# Patient Record
Sex: Male | Born: 1980 | Race: White | Hispanic: No | Marital: Married | Smoking: Never smoker
Health system: Southern US, Community
[De-identification: ages and names within clinical notes are randomized; demographics above are authoritative.]

## PROBLEM LIST (undated history)

## (undated) DIAGNOSIS — M791 Myalgia, unspecified site: Secondary | ICD-10-CM

## (undated) DIAGNOSIS — M542 Cervicalgia: Secondary | ICD-10-CM

## (undated) DIAGNOSIS — K279 Peptic ulcer, site unspecified, unspecified as acute or chronic, without hemorrhage or perforation: Secondary | ICD-10-CM

## (undated) DIAGNOSIS — J45909 Unspecified asthma, uncomplicated: Secondary | ICD-10-CM

## (undated) DIAGNOSIS — T7840XA Allergy, unspecified, initial encounter: Secondary | ICD-10-CM

## (undated) HISTORY — PX: SPINE SURGERY: SHX786

## (undated) HISTORY — DX: Myalgia, unspecified site: M79.10

## (undated) HISTORY — DX: Cervicalgia: M54.2

## (undated) HISTORY — DX: Unspecified asthma, uncomplicated: J45.909

## (undated) HISTORY — PX: APPENDECTOMY: SHX54

## (undated) HISTORY — DX: Allergy, unspecified, initial encounter: T78.40XA

## (undated) HISTORY — DX: Peptic ulcer, site unspecified, unspecified as acute or chronic, without hemorrhage or perforation: K27.9

---
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
## 2002-08-03 HISTORY — PX: SHOULDER SURGERY: SHX246

## 2013-09-06 ENCOUNTER — Encounter: Payer: Self-pay | Admitting: Family Medicine

## 2013-09-06 DIAGNOSIS — T7840XA Allergy, unspecified, initial encounter: Secondary | ICD-10-CM | POA: Insufficient documentation

## 2013-09-06 DIAGNOSIS — J45909 Unspecified asthma, uncomplicated: Secondary | ICD-10-CM | POA: Insufficient documentation

## 2013-09-18 ENCOUNTER — Ambulatory Visit (INDEPENDENT_AMBULATORY_CARE_PROVIDER_SITE_OTHER): Payer: BC Managed Care – PPO | Admitting: Physician Assistant

## 2013-09-18 ENCOUNTER — Encounter: Payer: Self-pay | Admitting: Physician Assistant

## 2013-09-18 VITALS — BP 142/98 | HR 100 | Temp 98.9°F | Resp 20 | Ht 70.5 in | Wt 208.0 lb

## 2013-09-18 DIAGNOSIS — R5383 Other fatigue: Secondary | ICD-10-CM

## 2013-09-18 DIAGNOSIS — R631 Polydipsia: Secondary | ICD-10-CM

## 2013-09-18 DIAGNOSIS — R350 Frequency of micturition: Secondary | ICD-10-CM

## 2013-09-18 DIAGNOSIS — R5381 Other malaise: Secondary | ICD-10-CM

## 2013-09-18 DIAGNOSIS — R110 Nausea: Secondary | ICD-10-CM

## 2013-09-18 DIAGNOSIS — R358 Other polyuria: Secondary | ICD-10-CM

## 2013-09-18 DIAGNOSIS — R634 Abnormal weight loss: Secondary | ICD-10-CM

## 2013-09-18 DIAGNOSIS — R3589 Other polyuria: Secondary | ICD-10-CM

## 2013-09-18 DIAGNOSIS — R197 Diarrhea, unspecified: Secondary | ICD-10-CM

## 2013-09-18 LAB — URINALYSIS, ROUTINE W REFLEX MICROSCOPIC
BILIRUBIN URINE: NEGATIVE
GLUCOSE, UA: NEGATIVE mg/dL
Ketones, ur: NEGATIVE mg/dL
Leukocytes, UA: NEGATIVE
Nitrite: NEGATIVE
PH: 7 (ref 5.0–8.0)
Protein, ur: NEGATIVE mg/dL
SPECIFIC GRAVITY, URINE: 1.01 (ref 1.005–1.030)
Urobilinogen, UA: 1 mg/dL (ref 0.0–1.0)

## 2013-09-18 LAB — COMPLETE METABOLIC PANEL WITH GFR
ALK PHOS: 67 U/L (ref 39–117)
ALT: 18 U/L (ref 0–53)
AST: 19 U/L (ref 0–37)
Albumin: 4.2 g/dL (ref 3.5–5.2)
BILIRUBIN TOTAL: 0.7 mg/dL (ref 0.2–1.2)
BUN: 6 mg/dL (ref 6–23)
CO2: 30 mEq/L (ref 19–32)
Calcium: 9.6 mg/dL (ref 8.4–10.5)
Chloride: 100 mEq/L (ref 96–112)
Creat: 1.26 mg/dL (ref 0.50–1.35)
GFR, Est African American: 87 mL/min
GFR, Est Non African American: 76 mL/min
GLUCOSE: 73 mg/dL (ref 70–99)
Potassium: 4.9 mEq/L (ref 3.5–5.3)
Sodium: 136 mEq/L (ref 135–145)
Total Protein: 7.3 g/dL (ref 6.0–8.3)

## 2013-09-18 LAB — CBC WITH DIFFERENTIAL/PLATELET
BASOS PCT: 1 % (ref 0–1)
Basophils Absolute: 0.1 10*3/uL (ref 0.0–0.1)
EOS ABS: 0.4 10*3/uL (ref 0.0–0.7)
Eosinophils Relative: 4 % (ref 0–5)
HCT: 50 % (ref 39.0–52.0)
HEMOGLOBIN: 17.1 g/dL — AB (ref 13.0–17.0)
Lymphocytes Relative: 23 % (ref 12–46)
Lymphs Abs: 2 10*3/uL (ref 0.7–4.0)
MCH: 30.4 pg (ref 26.0–34.0)
MCHC: 34.2 g/dL (ref 30.0–36.0)
MCV: 89 fL (ref 78.0–100.0)
MONOS PCT: 9 % (ref 3–12)
Monocytes Absolute: 0.8 10*3/uL (ref 0.1–1.0)
Neutro Abs: 5.5 10*3/uL (ref 1.7–7.7)
Neutrophils Relative %: 63 % (ref 43–77)
Platelets: 245 10*3/uL (ref 150–400)
RBC: 5.62 MIL/uL (ref 4.22–5.81)
RDW: 14.9 % (ref 11.5–15.5)
WBC: 8.8 10*3/uL (ref 4.0–10.5)

## 2013-09-18 LAB — URINALYSIS, MICROSCOPIC ONLY
Bacteria, UA: NONE SEEN
CRYSTALS: NONE SEEN
Casts: NONE SEEN
SQUAMOUS EPITHELIAL / LPF: NONE SEEN
WBC, UA: NONE SEEN WBC/hpf (ref <3)

## 2013-09-18 LAB — TSH: TSH: 0.967 u[IU]/mL (ref 0.350–4.500)

## 2013-09-18 LAB — OSMOLALITY, URINE: OSMOLALITY UR: 234 mosm/kg — AB (ref 390–1090)

## 2013-09-18 NOTE — Progress Notes (Signed)
Patient ID: Brandon Christensen MRN: [id number], DOB: 09/27/1980, [age] y.o. Date of Encounter: @DATE @  Chief Complaint:  Chief Complaint  Patient presents with  . new pt    back issues wants refill diclofenac,  hasn't been feeling well has been losing weight, tired all the time, frequent  urination and urinating alot,  also having diarrhea    HPI: 33 y.o. year old white male  presents as a new patient to our office today.    He reports that his only known past medical history has included allergies and asthma. Says he had an endoscopy back in 2002 that showed 2 stomach ulcers. Surgical history has included appendectomy and some orthopedic surgeries but that's all. Says he has had no other medical history and no known chronic medical problems.  He went to a urologist last week regarding these current symptoms. Says they did a prostate exam and said that they weren't sure what was causing his symptoms. Pt reports they did some blood work but he has not heard back from that.  He states that for the past 3-4 weeks he has had increased,  frequent urination. Says when he urinates he is urinating very large volume. Says he has weighed himself before and after urinating and "has pee ed out 4 pounds worth of urine."  Also has had 6-1/2 pounds of weight loss in the past 7-10 days. Says that prior to 10 days ago he had been gaining weight. He started  back to the gym and was actually trying to gain weight but all of a sudden over the past 7-10 days "the weight has just fallen off."  Also reports that he is feeling " run down".  He is also feeling extremely thirsty and feels as if he is dehydrated and his mouth is dry.  Says that his blood sugar was normal when he had that checked at the urologist.  Also reports having nausea and diarrhea over the past 3-4 weeks. Says that he is letting out a normal volume of stool but that it is looser than normal.  Says that  for the past month only about 2 stools have  been normal. Otherwise some are loose and some are little bit between normal and loose.  He denies any known recent head injury. Reports that he drinks no type of alcohol at all including beer liquor, wine.. I see that he is on creatine --on his medication list-- he says he's been using this for 15 years and this is nothing new.   Past Medical History  Diagnosis Date  . Allergy   . Asthma   . Peptic ulcer 2002    [place] showed 2 ulcers     Home Meds: See attached medication section for current medication list. Any medications entered into computer today will not appear on this note's list. The medications listed below were entered prior to today. Current Outpatient Prescriptions on File Prior to Visit  Medication Sig Dispense Refill  . diclofenac (VOLTAREN) 75 MG EC tablet Take 75 mg by mouth 2 (two) times daily.      Marland Kitchen. omeprazole (PRILOSEC) 20 MG capsule Take 20 mg by mouth daily.       No current facility-administered medications on file prior to visit.    Allergies:  Allergies  Allergen Reactions  . Amoxicillin Rash  . Clindamycin/Lincomycin Rash    History   Social History  . Marital Status: Married    Spouse Name: N/A  Number of Children: N/A  . Years of Education: N/A   Occupational History  . Not on file.   Social History Main Topics  . Smoking status: Never Smoker   . Smokeless tobacco: Never Used  . Alcohol Use: No  . Drug Use: No  . Sexual Activity: Yes    Birth Control/ Protection: None   Other Topics Concern  . Not on file   Social History Narrative  . No narrative on file    History reviewed. No pertinent family history.   Review of Systems:  See HPI for pertinent ROS. All other ROS negative.    Physical Exam: Blood pressure 142/98, pulse 100, temperature 98.9 F (37.2 C), temperature source Oral, resp. rate 20, height 5' 10.5" (1.791 m), weight 208 lb (94.348 kg)., Body mass index is 29.41 kg/(m^2). General: WNWD  WM. Appears in no acute distress. Neck: Supple. No thyromegaly. No lymphadenopathy. Lungs: Clear bilaterally to auscultation without wheezes, rales, or rhonchi. Breathing is unlabored. Heart: RRR with S1 S2. No murmurs, rubs, or gallops.Borderline tachycardia.  Abdomen: Soft, non-tender, non-distended with normoactive bowel sounds. No hepatomegaly. No rebound/guarding. No obvious abdominal masses. Musculoskeletal:  Strength and tone normal for age. Extremities/Skin: Warm and dry. No clubbing or cyanosis. No edema. No rashes or suspicious lesions. Neuro: Alert and oriented X 3. Moves all extremities spontaneously. Gait is normal. CNII-XII grossly in tact. Psych:  Responds to questions appropriately with a normal affect.     ASSESSMENT AND PLAN:  33 y.o. year old male with  1. Polydipsia - Urinalysis, Routine w reflex microscopic - CREATINE PO; Take by mouth daily. - Urine Microscopic - CBC with Differential - COMPLETE METABOLIC PANEL WITH GFR - TSH - Osmolality, urine - Arginine vasopressin hormone  2. Polyuria - Urinalysis, Routine w reflex microscopic - CREATINE PO; Take by mouth daily. - Urine Microscopic - CBC with Differential - COMPLETE METABOLIC PANEL WITH GFR - TSH - Osmolality, urine - Arginine vasopressin hormone  3. Weight loss - Urinalysis, Routine w reflex microscopic - CREATINE PO; Take by mouth daily. - Urine Microscopic - CBC with Differential - COMPLETE METABOLIC PANEL WITH GFR - TSH - Osmolality, urine - Arginine vasopressin hormone  4. Fatigue - Urinalysis, Routine w reflex microscopic - CREATINE PO; Take by mouth daily. - Urine Microscopic - CBC with Differential - COMPLETE METABOLIC PANEL WITH GFR - TSH - Osmolality, urine - Arginine vasopressin hormone  5. Nausea - Urinalysis, Routine w reflex microscopic - CREATINE PO; Take by mouth daily. - Urine Microscopic - CBC with Differential - COMPLETE METABOLIC PANEL WITH GFR - TSH -  Osmolality, urine - Arginine vasopressin hormone  6. Diarrhea - Urinalysis, Routine w reflex microscopic - CREATINE PO; Take by mouth daily. - Urine Microscopic - CBC with Differential - COMPLETE METABOLIC PANEL WITH GFR - TSH - Osmolality, urine - Arginine vasopressin hormone  7. Frequent urination - Urinalysis, Routine w reflex microscopic - CREATINE PO; Take by mouth daily. - Urine Microscopic - CBC with Differential - COMPLETE METABOLIC PANEL WITH GFR - TSH - Osmolality, urine - Arginine vasopressin hormone   Obtain these labs. I get results, I will follow up with the patient regarding further evaluation/treatment.   Signed, [address] [place], [place], [facility] 09/18/2013 11:48 AM

## 2013-09-21 ENCOUNTER — Telehealth: Payer: Self-pay | Admitting: Family Medicine

## 2013-09-21 DIAGNOSIS — R350 Frequency of micturition: Secondary | ICD-10-CM

## 2013-09-21 MED ORDER — OXYBUTYNIN CHLORIDE ER 5 MG PO TB24: 5.0000 mg | ORAL_TABLET | Freq: Every day | ORAL | Status: DC

## 2013-09-21 NOTE — Telephone Encounter (Signed)
Message copied by Donne AnonPLUMMER, Charlayne Vultaggio M on Thu Sep 21, 2013  1:13 PM ------      Message from: Allayne ButcherIXON, MARY      Created: Thu Sep 21, 2013  7:53 AM       Tell patient that one lab is still pending. I will let him know once we get the results.      Tell him all the other results are in and are all  normal.      Tell him that we will start him on a medication he will take once a day. Have him schedule a followup office visit with me in 3 weeks.      Send prescription for:       Ditropan XL 5mg  one po QD # 30 + 1 refill. ------

## 2013-09-21 NOTE — Telephone Encounter (Signed)
Pt aware of lab results.  Rx for ditropan to pharmacy.  Made 3 week follow up appt.

## 2013-10-02 LAB — ARGININE VASOPRESSIN HORMONE: Arginine Vasopressin: <1 pg/mL — ABNORMAL LOW

## 2013-10-12 ENCOUNTER — Ambulatory Visit: Payer: Self-pay | Admitting: Physician Assistant

## 2013-10-19 ENCOUNTER — Telehealth: Payer: Self-pay | Admitting: Family Medicine

## 2013-10-19 NOTE — Telephone Encounter (Signed)
That is fine to change to the Myrbetriq.  Send prescription for Myrbetriq  One po QD # 30 / 3 refills. He had a recent office visit scheduled with me but did not show.  Tell him that he needs to schedule another followup office visit with me to have in the next month.

## 2013-10-19 NOTE — Telephone Encounter (Signed)
Message copied by Donne AnonPLUMMER, Jatziri Goffredo M on Thu Oct 19, 2013  2:50 PM ------      Message from: [facility], Maralyn SagoSARAH J      Created: Thu Oct 19, 2013 11:49 AM      Contact: [phone]       The generic Ditropan worked but caused sexual side effects      He wants to try Myrbetriq because according to his research it does not cause the side effects.  [facility]  Please call him and let him know ------

## 2013-10-20 ENCOUNTER — Telehealth: Payer: Self-pay | Admitting: Family Medicine

## 2013-10-20 MED ORDER — MIRABEGRON ER 25 MG PO TB24: 25.0000 mg | ORAL_TABLET | Freq: Every day | ORAL | Status: DC

## 2013-10-20 MED ORDER — TOLTERODINE TARTRATE 1 MG PO TABS: 1.0000 mg | ORAL_TABLET | Freq: Two times a day (BID) | ORAL | Status: DC

## 2013-10-20 NOTE — Telephone Encounter (Signed)
Can send prescription for: Detrol 1 mg  one by mouth twice a day  #60 with 3 refills. Thanks

## 2013-10-20 NOTE — Telephone Encounter (Signed)
Pt called to say Myrbetriq was over $200 after insurance paid.  Way too expensive for him!!  Would now like to try Generic Detrol.

## 2013-10-20 NOTE — Telephone Encounter (Signed)
Spoke to patient.  He has already rescheduled appt for 11/02/13.  Told new Rx has been sent to pharmacy.

## 2013-10-20 NOTE — Telephone Encounter (Signed)
Pt aware new Rx sent

## 2013-11-02 ENCOUNTER — Encounter: Payer: Self-pay | Admitting: Physician Assistant

## 2013-11-02 ENCOUNTER — Ambulatory Visit (INDEPENDENT_AMBULATORY_CARE_PROVIDER_SITE_OTHER): Payer: BC Managed Care – PPO | Admitting: Physician Assistant

## 2013-11-02 VITALS — BP 132/84 | HR 92 | Temp 98.4°F | Resp 18 | Wt 215.0 lb

## 2013-11-02 DIAGNOSIS — K219 Gastro-esophageal reflux disease without esophagitis: Secondary | ICD-10-CM

## 2013-11-02 DIAGNOSIS — M545 Low back pain, unspecified: Secondary | ICD-10-CM

## 2013-11-02 DIAGNOSIS — N318 Other neuromuscular dysfunction of bladder: Secondary | ICD-10-CM

## 2013-11-02 DIAGNOSIS — N3281 Overactive bladder: Secondary | ICD-10-CM | POA: Insufficient documentation

## 2013-11-02 DIAGNOSIS — G8929 Other chronic pain: Secondary | ICD-10-CM

## 2013-11-02 DIAGNOSIS — K279 Peptic ulcer, site unspecified, unspecified as acute or chronic, without hemorrhage or perforation: Secondary | ICD-10-CM

## 2013-11-02 MED ORDER — DICLOFENAC SODIUM 75 MG PO TBEC: 75.0000 mg | DELAYED_RELEASE_TABLET | Freq: Two times a day (BID) | ORAL | Status: DC

## 2013-11-02 MED ORDER — TOLTERODINE TARTRATE 1 MG PO TABS
1.0000 mg | ORAL_TABLET | Freq: Two times a day (BID) | ORAL | Status: DC
Start: 2013-11-02 — End: 2014-03-21

## 2013-11-02 MED ORDER — OMEPRAZOLE 20 MG PO CPDR: 20.0000 mg | DELAYED_RELEASE_CAPSULE | Freq: Every day | ORAL | Status: AC

## 2013-11-02 NOTE — Progress Notes (Signed)
Patient ID: Brandon Christensen MRN: [id number], DOB: 1980-08-08, 33 y.o. Date of Encounter: @DATE @  Chief Complaint:  Chief Complaint  Patient presents with  . 3 week follow up    refill diclofenac, is fasting    HPI: 33 y.o. year old white male  presents for followup office visit.  I saw him as a new patient to our office on 09/18/13.  At that visit he reported that for the past 3-4 weeks he's been having increased frequent urination. Reportedly he was also urinating out large volumes of urine. At that time he also had complaints of unintentional weight loss and fatigue. Today he was feeling thirsty and dry mouth. Also said that for 3-4 weeks he had had some nausea and diarrhea.  At the time of that visit he told me that he had recently seen a urologist regarding these symptoms as well. Reported that they were unable to find any abnormalities on their exam.  After the time of the patient's visit with me, I did receive a copy of the urology note. The urology note stated that they felt that his symptoms may be secondary to anxiety. Toay I discussed this with the patient. However he says he has no anxiety whatsoever and this is not the cause of his symptoms.  The time of his visit with me 09/18/13 I did extensive lab work. This Was all normal. I recommended that he start Ditropan XL 5 mg daily and then return for followup visit. Because of insurance coverage [facility] which he is taking twice daily. He says that this is working well. Is completely controlling his symptoms and he is having no adverse effects. Says he has no problems remembering to take it twice a day and was to continue this medication.  As well, he is requesting refills on his diclofenac. Says he's been taking this twice daily for years. Takes this for chronic low back pain. Says that when he was living in [place] he had 2 surgeries on his low back. That surgeon in [place] recommended a third surgery  2 years ago. However patient did not followup with surgery and instead,   the pain has been controlled with his diclofenac ever since.  He has a remote history of peptic ulcer disease back in 2002 but no known ulcer since then. Says he does take the diclofenac with food and also does take Prilosec daily. He is having no symptoms of stomach ulcer.   Past Medical History  Diagnosis Date  . Allergy   . Asthma   . Peptic ulcer 2002    [place] [facility]EGD showed 2 ulcers     Home Meds: See attached medication section for current medication list. Any medications entered into computer today will not appear on this note's list. The medications listed below were entered prior to today. Current Outpatient Prescriptions on File Prior to Visit  Medication Sig Dispense Refill  . CREATINE PO Take by mouth daily.       No current facility-administered medications on file prior to visit.    Allergies:  Allergies  Allergen Reactions  . Amoxicillin Rash  . Clindamycin/Lincomycin Rash    History   Social History  . Marital Status: Married    Spouse Name: N/A    Number of Children: N/A  . Years of Education: N/A   Occupational History  . Not on file.   Social History Main Topics  . Smoking status: Never Smoker   .  Smokeless tobacco: Never Used  . Alcohol Use: No  . Drug Use: No  . Sexual Activity: Yes    Birth Control/ Protection: None   Other Topics Concern  . Not on file   Social History Narrative  . No narrative on file    History reviewed. No pertinent family history.   Review of Systems:  See HPI for pertinent ROS. All other ROS negative.    Physical Exam: Blood pressure 132/84, pulse 92, temperature 98.4 F (36.9 C), temperature source Oral, resp. rate 18, weight 215 lb (97.523 kg)., Body mass index is 30.4 kg/(m^2). General: WNWD WMAppears in no acute distress. Neck: Supple. No thyromegaly. No lymphadenopathy. Lungs: Clear bilaterally to auscultation  without wheezes, rales, or rhonchi. Breathing is unlabored. Heart: RRR with S1 S2. No murmurs, rubs, or gallops. Abdomen: Soft, non-tender, non-distended with normoactive bowel sounds. No hepatomegaly. No rebound/guarding. No obvious abdominal masses. Musculoskeletal:  Strength and tone normal for age. Extremities/Skin: Warm and dry.  No edema. Neuro: Alert and oriented X 3. Moves all extremities spontaneously. Gait is normal. CNII-XII grossly in tact. Psych:  Responds to questions appropriately with a normal affect.     ASSESSMENT AND PLAN:  33 y.o. year old male with  1. Chronic low back pain - diclofenac (VOLTAREN) 75 MG EC tablet; Take 1 tablet (75 mg total) by mouth 2 (two) times daily.  Dispense: 60 tablet; Refill: 11  2. Overactive bladder - tolterodine (DETROL) 1 MG tablet; Take 1 tablet (1 mg total) by mouth 2 (two) times daily.  Dispense: 60 tablet; Refill: 11  3. GERD (gastroesophageal reflux disease) - omeprazole (PRILOSEC) 20 MG capsule; Take 1 capsule (20 mg total) by mouth daily.  Dispense: 30 capsule; Refill: 11 He is currently buying over-the-counter Prilosec. Told him that these are basically the same medications but he can compare the cost of getting the prescription omeprazole versus  over-the-counter Prilosec.  4. H/O PUD (peptic ulcer disease) - omeprazole (PRILOSEC) 20 MG capsule; Take 1 capsule (20 mg total) by mouth daily.  Dispense: 30 capsule; Refill: 11  Can ait one year for followup visit if everything remains stable. Followup sooner if needed.  [address] [place], [place], [facility] 11/02/2013 9:38 AM

## 2013-12-08 ENCOUNTER — Telehealth: Payer: Self-pay | Admitting: Physician Assistant

## 2013-12-08 DIAGNOSIS — M545 Low back pain: Principal | ICD-10-CM

## 2013-12-08 DIAGNOSIS — G8929 Other chronic pain: Secondary | ICD-10-CM

## 2013-12-08 MED ORDER — DICLOFENAC SODIUM 75 MG PO TBEC: 75.0000 mg | DELAYED_RELEASE_TABLET | Freq: Two times a day (BID) | ORAL | Status: DC

## 2013-12-08 NOTE — Telephone Encounter (Signed)
Medication refilled per protocol. 

## 2013-12-08 NOTE — Telephone Encounter (Signed)
[phone] Pt is needing a refill on  diclofenac (VOLTAREN) 75 MG EC tablet he took his last one this morning  [facility]

## 2013-12-27 ENCOUNTER — Encounter: Payer: Self-pay | Admitting: Family Medicine

## 2013-12-27 ENCOUNTER — Ambulatory Visit (INDEPENDENT_AMBULATORY_CARE_PROVIDER_SITE_OTHER): Payer: BC Managed Care – PPO | Admitting: Family Medicine

## 2013-12-27 VITALS — BP 132/70 | HR 78 | Temp 97.4°F | Resp 16 | Ht 70.0 in | Wt 201.0 lb

## 2013-12-27 DIAGNOSIS — M25469 Effusion, unspecified knee: Secondary | ICD-10-CM

## 2013-12-27 DIAGNOSIS — M25569 Pain in unspecified knee: Secondary | ICD-10-CM

## 2013-12-27 DIAGNOSIS — M25562 Pain in left knee: Secondary | ICD-10-CM

## 2013-12-27 DIAGNOSIS — M25462 Effusion, left knee: Secondary | ICD-10-CM

## 2013-12-27 MED ORDER — MELOXICAM 15 MG PO TABS: 15.0000 mg | ORAL_TABLET | Freq: Every day | ORAL | Status: DC

## 2013-12-27 NOTE — Patient Instructions (Signed)
Take the meloxicam   ICE pack  Use ACE Wrap  Call if you want to pursue orthopedics

## 2013-12-28 NOTE — Progress Notes (Signed)
Patient ID: Brandon Christensen, male   DOB: September 02, 1980, 33 y.o.   MRN: [id number]   Subjective:    Patient ID: Brandon Christensen, male    DOB: March 16, 1981, 33 y.o.   MRN: [id number]  Patient presents for L knee pain  patient here with left knee pain. States she was playing basketball on Sunday when he would improve his foot and his knee twisted. He have some pain afterwards but there was no significant swelling he continued to play on it. At the next day he noticed that there was more swelling of the knee towards the middle in his pain when he does any kind of turning motion with his leg and he also feels like it is locking up. He has been taking diclofenac which she's been on for many years secondary to history of back surgeries and shoulder surgeries. He also use an Ace wrap and some ice. He's planning to go out of town this weekend we'll not be able to have any evaluation until he returns.    Review Of Systems:  GEN- denies fatigue, fever, weight loss,weakness, recent illness HEENT- denies eye drainage, change in vision, nasal discharge, CVS- denies chest pain, palpitations RESP- denies SOB, cough, wheeze MSK- + joint pain, muscle aches, injury Neuro- denies headache, dizziness, syncope, seizure activity       Objective:    BP 132/70  Pulse 78  Temp(Src) 97.4 F (36.3 C) (Oral)  Resp 16  Ht 5\' 10"  (1.778 m)  Wt 201 lb (91.173 kg)  BMI 28.84 kg/m2 GEN- NAD, alert and oriented x3 MSK- Right knee- normal inspection, no effusion, good ROM, left knee swelling upper medial aspect of knee, NT of patella, no bruising, pain with full extension, fair flexion, neg lachmans, TTP over region of swelling, slight antalgic gait Neuro- normal tone, sensation in tact LE, strength decreased some in LLE compared to right due to pain Skin- in tact, no ecchymosis Pulse- DP intact 2+ bilat        Assessment & Plan:      Problem List Items Addressed This Visit   None    Visit Diagnoses   Knee pain, left     -  Primary    Concern for possible tear, the swelling is mostly where the quads would insert medially, he will be referred to ortho, change to meloxicam, ICE, wrap, he declines pain meds       Note: This dictation was prepared with Dragon dictation along with smaller phrase technology. Any transcriptional errors that result from this process are unintentional.

## 2014-01-08 ENCOUNTER — Ambulatory Visit: Payer: BC Managed Care – PPO | Admitting: Family Medicine

## 2014-03-21 ENCOUNTER — Ambulatory Visit (INDEPENDENT_AMBULATORY_CARE_PROVIDER_SITE_OTHER): Payer: BC Managed Care – PPO | Admitting: Physician Assistant

## 2014-03-21 ENCOUNTER — Encounter: Payer: Self-pay | Admitting: Physician Assistant

## 2014-03-21 VITALS — BP 130/78 | HR 88 | Temp 98.0°F | Resp 16 | Ht 70.5 in | Wt 197.0 lb

## 2014-03-21 DIAGNOSIS — M542 Cervicalgia: Secondary | ICD-10-CM | POA: Insufficient documentation

## 2014-03-21 NOTE — Progress Notes (Signed)
Patient ID: Brandon Christensen MRN: [id number], DOB: 05-03-1981, 33 y.o. Date of Encounter: 03/21/2014, 3:19 PM    Chief Complaint:  Chief Complaint  Patient presents with  . Needs referral to MD for botox in neck    Dr. Lissa Hoard Pasi - [phone] [facility] [address]  . Blisters in mouth     HPI: 33 y.o. year old white male says that when he was 33 years old he had an injury while on a trampoline. Says that he now has problems with the muscles in his right neck being extremely tight and pulling his head to the right. Says that he has seen a chiropractor for multiple visits. Also has seen orthopedics at [facility]. Has had x-ray and MRI.  Says that he is interested in seeing a physician who can do Botox injections to the muscles of the neck. He says that he obtained the information listed above from a website. Says that this is the only physician that he can find listed that does these injections. Says that he would prefer to see someone in [place] / someone with a closer location but did not know who could do these injections for him.     Home Meds:   Outpatient Prescriptions Prior to Visit  Medication Sig Dispense Refill  . diclofenac (VOLTAREN) 75 MG EC tablet Take 1 tablet (75 mg total) by mouth 2 (two) times daily.  60 tablet  11  . omeprazole (PRILOSEC) 20 MG capsule Take 1 capsule (20 mg total) by mouth daily.  30 capsule  11  . CREATINE PO Take by mouth daily.      . meloxicam (MOBIC) 15 MG tablet Take 1 tablet (15 mg total) by mouth daily.  30 tablet  2  . tolterodine (DETROL) 1 MG tablet Take 1 tablet (1 mg total) by mouth 2 (two) times daily.  60 tablet  11   No facility-administered medications prior to visit.    Allergies:  Allergies  Allergen Reactions  . Amoxicillin Rash  . Clindamycin/Lincomycin Rash      Review of Systems: See HPI for pertinent ROS. All other ROS negative.    Physical Exam: Blood pressure 130/78, pulse 88, temperature 98  F (36.7 C), temperature source Oral, resp. rate 16, height 5' 10.5" (1.791 m), weight 197 lb (89.359 kg)., Body mass index is 27.86 kg/(m^2). General: WNWD WM.  Appears in no acute distress. Neck: Supple. No thyromegaly. No lymphadenopathy. Muscles of the right neck are tender with palpation. They are tight. His range of motion is limited and with all motion his neck is pulling to the right. Lungs: Clear bilaterally to auscultation without wheezes, rales, or rhonchi. Breathing is unlabored. Heart: Regular rhythm. No murmurs, rubs, or gallops. Msk:  Strength and tone normal for age. Extremities/Skin: Warm and dry. Neuro: Alert and oriented X 3. Moves all extremities spontaneously. Gait is normal. CNII-XII grossly in tact. Psych:  Responds to questions appropriately with a normal affect.     ASSESSMENT AND PLAN:  33 y.o. year old male with  1. 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 I discussed this with the patient and he definitely would prefer to go to [facility] if possible. I will place order accordingly. - Ambulatory referral to Neurology   Signed, Frazier Richards, PA, BSFM  03/21/2014 3:19 PM

## 2014-03-29 ENCOUNTER — Ambulatory Visit (INDEPENDENT_AMBULATORY_CARE_PROVIDER_SITE_OTHER): Payer: BC Managed Care – PPO | Admitting: Neurology

## 2014-03-29 ENCOUNTER — Encounter: Payer: Self-pay | Admitting: Neurology

## 2014-03-29 VITALS — BP 172/108 | HR 86 | Ht 72.0 in | Wt 199.0 lb

## 2014-03-29 DIAGNOSIS — M791 Myalgia, unspecified site: Secondary | ICD-10-CM | POA: Insufficient documentation

## 2014-03-29 DIAGNOSIS — G8929 Other chronic pain: Secondary | ICD-10-CM

## 2014-03-29 DIAGNOSIS — M542 Cervicalgia: Secondary | ICD-10-CM

## 2014-03-29 DIAGNOSIS — G243 Spasmodic torticollis: Secondary | ICD-10-CM

## 2014-03-29 NOTE — Progress Notes (Signed)
PATIENT: Brandon Christensen DOB: August 09, 1980  HISTORICAL  Issiac Lavalley is a 33 years old right-handed male, referred by her by his primary care physician assistant Allayne Butcher, for evaluation of right-sided neck pain, muscle spasm  At age 70, he landed on his neck at trampline, he had  chronic neck pain ever since then, getting worse since age twenties, radiating pain from the right neck, to his right lateral arm, sometimes involving the right fourth, and fifth fingers, but he denies persistent numbness, no significant weakness  He has worked hard labor, he had L5-S1 herniated disc, had lumbar decompression surgery twice, in January, in September 2011, this was done by neurosurgeon at [facility]   Over the years, he had has tried different treatment for his chronic neck pain, including thermotherapy, chiropractor, massage, and NSAIDS, without persistent help,  He also noticed to have mild abnormal posture, tends to tilt his head backwards, neck titled towards his left shoulder, mild left shoulder elevations,  There was a mention of Botox injection in the past but was never tried,   REVIEW OF SYSTEMS: Full 14 system review of systems performed and notable only for  Low back pain, joint swelling, numbness,  ALLERGIES: Allergies  Allergen Reactions  . Amoxicillin Rash  . Clindamycin/Lincomycin Rash    HOME MEDICATIONS: Current Outpatient Prescriptions on File Prior to Visit  Medication Sig Dispense Refill  . CREATINE PO Take by mouth daily.      . diclofenac (VOLTAREN) 75 MG EC tablet Take 1 tablet (75 mg total) by mouth 2 (two) times daily.  60 tablet  11  . omeprazole (PRILOSEC) 20 MG capsule Take 1 capsule (20 mg total) by mouth daily.  30 capsule  11     PAST MEDICAL HISTORY: Past Medical History  Diagnosis Date  . Allergy   . Asthma   . Peptic ulcer 2002    Had EGD- in [facility], [place]--EGD showed 2 ulcers  . Neck pain   . Muscle pain     PAST SURGICAL  HISTORY: Past Surgical History  Procedure Laterality Date  . Appendectomy    . Spine surgery  1/11, 9/11    discectomy  . Shoulder surgery Bilateral 2004    FAMILY HISTORY: History reviewed. No pertinent family history.  SOCIAL HISTORY:  History   Social History  . Marital Status: Married    Spouse Name: Lanora Manis    Number of Children: 4  . Years of Education: college   Occupational History  . Not on file.   Social History Main Topics  . Smoking status: Never Smoker   . Smokeless tobacco: Never Used  . Alcohol Use: No  . Drug Use: No  . Sexual Activity: Yes    Birth Control/ Protection: None   Other Topics Concern  .    Social History Narrative   Patient lives at home with his wife Lanora Manis). Patient has four children.   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   Education [employer] and is going to college now.   Right handed.   Caffeine one two liter daily mountain dew.     PHYSICAL EXAM   Filed Vitals:   03/29/14 0840  BP: 172/108  Pulse: 86  Height: 6' (1.829 m)  Weight: 199 lb (90.266 kg)    Not recorded    Body mass index is 26.98 kg/(m^2).   Generalized: In no acute distress  Neck: Supple, no carotid bruits   Cardiac: Regular  rate rhythm  Pulmonary: Clear to auscultation bilaterally  Musculoskeletal: No deformity  Neurological examination  Mentation: Alert oriented to time, place, history taking, and causual conversation, he has mild retrocollis, left tilt, left shoulder elevations  Cranial nerve II-XII: Pupils were equal round reactive to light. Extraocular movements were full.  Visual field were full on confrontational test. Bilateral fundi were sharp.  Facial sensation and strength were normal. Hearing was intact to finger rubbing bilaterally. Uvula tongue midline.  Head turning and shoulder shrug and were normal and symmetric.Tongue protrusion into cheek strength was normal.  Motor: Normal tone, bulk and  strength.  Sensory: Intact to fine touch, pinprick, preserved vibratory sensation, and proprioception at toes.  Coordination: Normal finger to nose, heel-to-shin bilaterally there was no truncal ataxia  Gait: Rising up from seated position without assistance, normal stance, without trunk ataxia, moderate stride, good arm swing, smooth turning, able to perform tiptoe, and heel walking without difficulty.   Romberg signs: Negative  Deep tendon reflexes: Brachioradialis 2/2, biceps 2/2, triceps 2/2, patellar 2/2, Achilles 2/2, plantar responses were flexor bilaterally.   DIAGNOSTIC DATA (LABS, IMAGING, TESTING) - I reviewed patient records, labs, notes, testing and imaging myself where available.  Lab Results  Component Value Date   WBC 8.8 09/18/2013   HGB 17.1* 09/18/2013   HCT 50.0 09/18/2013   MCV 89.0 09/18/2013   PLT 245 09/18/2013      Component Value Date/Time   NA 136 09/18/2013 1059   K 4.9 09/18/2013 1059   CL 100 09/18/2013 1059   CO2 30 09/18/2013 1059   GLUCOSE 73 09/18/2013 1059   BUN 6 09/18/2013 1059   CREATININE 1.26 09/18/2013 1059   CALCIUM 9.6 09/18/2013 1059   PROT 7.3 09/18/2013 1059   ALBUMIN 4.2 09/18/2013 1059   AST 19 09/18/2013 1059   ALT 18 09/18/2013 1059   ALKPHOS 67 09/18/2013 1059   BILITOT 0.7 09/18/2013 1059   GFRNONAA 76 09/18/2013 1059   GFRAA 87 09/18/2013 1059   Lab Results  Component Value Date   TSH 0.967 09/18/2013      ASSESSMENT AND PLAN  Mykai Balaban is a 33 y.o. male complains of  chronic neck pain, radiating pain to his right arm, and right shoulder, recent MRI of the cervical spine per patient report no significant canal, or foraminal stenosis. This was done at [facility].   1. 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   Levert Feinstein, M.D. Ph.D.  [facility] [address] [place], [place] [address] [phone]

## 2014-04-12 ENCOUNTER — Telehealth: Payer: Self-pay | Admitting: Neurology

## 2014-04-12 NOTE — Telephone Encounter (Signed)
Brandon Christensen, please let patient know that

## 2014-04-12 NOTE — Telephone Encounter (Signed)
Message copied by Levert Feinstein on Thu Apr 12, 2014  8:34 AM ------      Message from: Eugenie Birks      Created: Wed Apr 11, 2014  8:12 AM       This patient was denied by his insurance for not meeting the criteria. Would you like to submit more information? ------

## 2014-05-02 ENCOUNTER — Ambulatory Visit (INDEPENDENT_AMBULATORY_CARE_PROVIDER_SITE_OTHER): Payer: BC Managed Care – PPO | Admitting: Neurology

## 2014-05-02 ENCOUNTER — Encounter: Payer: Self-pay | Admitting: Neurology

## 2014-05-02 ENCOUNTER — Encounter (INDEPENDENT_AMBULATORY_CARE_PROVIDER_SITE_OTHER): Payer: Self-pay

## 2014-05-02 DIAGNOSIS — M791 Myalgia, unspecified site: Secondary | ICD-10-CM

## 2014-05-02 DIAGNOSIS — M542 Cervicalgia: Secondary | ICD-10-CM

## 2014-05-02 DIAGNOSIS — G243 Spasmodic torticollis: Secondary | ICD-10-CM | POA: Insufficient documentation

## 2014-05-02 MED ORDER — INCOBOTULINUMTOXINA 100 UNITS IM SOLR
100.0000 [IU] | Freq: Once | INTRAMUSCULAR | Status: AC
  Administered 2014-05-02: 100 [IU] via INTRAMUSCULAR

## 2014-05-02 NOTE — Progress Notes (Signed)
PATIENT: Londell MohJosh Sager DOB: [birth year]  HISTORICAL  Sharia ReeveJosh Fran LowesHolder is a 33 years old right-handed male, referred by her by his primary care physician assistant Allayne ButcherMary Dixon, for evaluation of right-sided neck pain, muscle spasm  At age 33, he landed on his neck at trampline, he had  chronic neck pain ever since then, getting worse since age twenties, radiating pain from the right neck, to his right lateral arm, sometimes involving the right fourth, and fifth fingers, but he denies persistent numbness, no significant weakness  He has worked hard labor, he had L5-S1 herniated disc, had lumbar decompression surgery twice, in January and then in September 2011, [facility]   Over the years, he had has tried different treatment for his chronic neck pain, including thermotherapy, chiropractor, massage, and NSAIDS, without persistent help,  He also noticed to have mild abnormal posture, tends to tilt his head backwards, neck titled towards his left shoulder, mild left shoulder elevations,  There was a mention of Botox injection in the past but was never tried  UPDATE Sept 30th 2015: Potential side effect of botulism toxin was explained to him he voiced understanding, sign the paperwork for consent, he continued to complain of right levator scapular, right cervical paraspinal muscle deep achy pain, abnormal neck posture, tends to lean his head to the left side, mild left shoulder elevation  We have reviewed [facility] there was degenerative disc disease at C3-4, C4-5, no significant foraminal stenosis, or canal stenosis.  REVIEW OF SYSTEMS: Full 14 system review of systems performed and notable only for  blurry vision, headaches,    ALLERGIES: Allergies  Allergen Reactions  . Amoxicillin Rash  . Clindamycin/Lincomycin Rash    HOME MEDICATIONS: Current Outpatient Prescriptions on File Prior to Visit   Medication Sig Dispense Refill  . CREATINE PO Take by mouth daily.      . diclofenac (VOLTAREN) 75 MG EC tablet Take 1 tablet (75 mg total) by mouth 2 (two) times daily.  60 tablet  11  . omeprazole (PRILOSEC) 20 MG capsule Take 1 capsule (20 mg total) by mouth daily.  30 capsule  11     PAST MEDICAL HISTORY: Past Medical History  Diagnosis Date  . Allergy   . Asthma   . Peptic ulcer 2002    [facility] [place]EGD showed 2 ulcers  . Neck pain   . Muscle pain     PAST SURGICAL HISTORY: Past Surgical History  Procedure Laterality Date  . Appendectomy    . Spine surgery  1/11, 9/11    discectomy  . Shoulder surgery Bilateral 2004    FAMILY HISTORY: History reviewed. No pertinent family history.  SOCIAL HISTORY:  History   Social History  . Marital Status: Married    Spouse Name: Lanora Manislizabeth    Number of Children: 4  . Years of Education: college   Occupational History  . Not on file.   Social History Main Topics  . Smoking status: Never Smoker   . Smokeless tobacco: Never Used  . Alcohol Use: No  . Drug Use: No  . Sexual Activity: Yes    Birth Control/ Protection: None   Other Topics Concern  .    Social History Narrative   Patient lives at home with his wife Lanora Manis([place]). Patient has four children.   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   Education  [employer] and is going to college now.   Right handed.   Caffeine one two liter daily mountain dew.     PHYSICAL EXAM   Filed Vitals:    Not recorded    Cannot calculate BMI with a height equal to zero.   Generalized: In no acute distress  Neck: Supple, no carotid bruits   Cardiac: Regular rate rhythm  Pulmonary: Clear to auscultation bilaterally  Musculoskeletal: No deformity  Neurological examination  Mentation: Alert oriented to time, place, history taking, and causual conversation, he has mild retrocollis, mild  left tilt, left shoulder  elevations  Cranial nerve II-XII: Pupils were equal round reactive to light. Extraocular movements were full.  Visual field were full on confrontational test. Bilateral fundi were sharp.  Facial sensation and strength were normal. Hearing was intact to finger rubbing bilaterally. Uvula tongue midline.  Head turning and shoulder shrug and were normal and symmetric.Tongue protrusion into cheek strength was normal.  Motor: Normal tone, bulk and strength.  Sensory: Intact to fine touch, pinprick, preserved vibratory sensation, and proprioception at toes.  Coordination: Normal finger to nose, heel-to-shin bilaterally there was no truncal ataxia  Gait: Rising up from seated position without assistance, normal stance, without trunk ataxia, moderate stride, good arm swing, smooth turning, able to perform tiptoe, and heel walking without difficulty.   Romberg signs: Negative  Deep tendon reflexes: Brachioradialis 2/2, biceps 2/2, triceps 2/2, patellar 2/2, Achilles 2/2, plantar responses were flexor bilaterally.   DIAGNOSTIC DATA (LABS, IMAGING, TESTING) - I reviewed patient records, labs, notes, testing and imaging myself where available.  Lab Results  Component Value Date   WBC 8.8 09/18/2013   HGB 17.1* 09/18/2013   HCT 50.0 09/18/2013   MCV 89.0 09/18/2013   PLT 245 09/18/2013      Component Value Date/Time   NA 136 09/18/2013 1059   K 4.9 09/18/2013 1059   CL 100 09/18/2013 1059   CO2 30 09/18/2013 1059   GLUCOSE 73 09/18/2013 1059   BUN 6 09/18/2013 1059   CREATININE 1.26 09/18/2013 1059   CALCIUM 9.6 09/18/2013 1059   PROT 7.3 09/18/2013 1059   ALBUMIN 4.2 09/18/2013 1059   AST 19 09/18/2013 1059   ALT 18 09/18/2013 1059   ALKPHOS 67 09/18/2013 1059   BILITOT 0.7 09/18/2013 1059   GFRNONAA 76 09/18/2013 1059   GFRAA 87 09/18/2013 1059   Lab Results  Component Value Date   TSH 0.967 09/18/2013      ASSESSMENT AND PLAN  Janari Sedor is a 33 y.o. male complains of  chronic neck pain,  radiating pain to his right arm, and right shoulder, recent MRI of the cervical spine per patient report no significant canal, or foraminal stenosis, mild degenerative disc disease at C3-4-5   Under EMG guidance, 100 units of xeomin was injected into his right cervical paraspinal muscles  Right splenius capitis 25 Right splenius cervix 25 Right longissimus capitis 12.5 Right semispinalis 12.5  Right levator scapular 25  He tolerated the injection well, will return to clinic in 3 months for repeat injection  Levert Feinstein, M.D. Ph.D.  [facility] [address], [address] [place], [place] [address] [phone]

## 2014-08-01 ENCOUNTER — Encounter: Payer: Self-pay | Admitting: Neurology

## 2014-08-01 ENCOUNTER — Ambulatory Visit (INDEPENDENT_AMBULATORY_CARE_PROVIDER_SITE_OTHER): Payer: BC Managed Care – PPO | Admitting: Neurology

## 2014-08-01 DIAGNOSIS — G243 Spasmodic torticollis: Secondary | ICD-10-CM

## 2014-08-01 DIAGNOSIS — M542 Cervicalgia: Secondary | ICD-10-CM

## 2014-08-01 MED ORDER — GABAPENTIN 300 MG PO CAPS: 300.0000 mg | ORAL_CAPSULE | Freq: Three times a day (TID) | ORAL | Status: DC

## 2014-08-01 MED ORDER — INCOBOTULINUMTOXINA 100 UNITS IM SOLR
200.0000 [IU] | Freq: Once | INTRAMUSCULAR | Status: AC
  Administered 2014-08-01: 200 [IU] via INTRAMUSCULAR

## 2014-08-01 MED ORDER — CYCLOBENZAPRINE HCL 10 MG PO TABS: 10.0000 mg | ORAL_TABLET | Freq: Two times a day (BID) | ORAL | Status: DC

## 2014-08-01 NOTE — Progress Notes (Signed)
PATIENT: Brandon Christensen DOB: 28-Dec-1980  HISTORICAL  Brandon ReeveJosh Fran LowesHolder is a 33 years old right-handed male, referred by her by his primary care physician assistant Allayne ButcherMary Dixon, for evaluation of right-sided neck pain, muscle spasm  At age 33, he landed on his neck at trampline, he had  chronic neck pain ever since then, getting worse since age twenties, radiating pain from the right neck, to his right lateral arm, sometimes involving the right fourth, and fifth fingers, but he denies persistent numbness, no significant weakness  He has worked hard labor, he had L5-S1 herniated disc, had lumbar decompression surgery twice, in January and then in September 2011, [facility]   Over the years, he had has tried different treatment for his chronic neck pain, including thermotherapy, chiropractor, massage, and NSAIDS, without persistent help,  He also noticed to have mild abnormal posture, tends to tilt his head backwards, neck titled towards his left shoulder, mild left shoulder elevations,  There was a mention of Botox injection in the past but was never tried  Initial Injection Sept 30th 2015: Potential side effect of botulism toxin was explained to him he voiced understanding, sign the paperwork for consent, he continued to complain of right levator scapular, right cervical paraspinal muscle deep achy pain, abnormal neck posture, tends to lean his head to the left side, mild left shoulder elevation  We have reviewed [facility], there was degenerative disc disease at C3-4, C4-5, no significant foraminal stenosis, or canal stenosis.  He received 100 units of Xeomin, under EMG guidance  UPDATE Dec 30th 2015: He responded mildly to previous injection in May 02 2014, he noticed mild neck extension weakness,. It did help his right side neck pain,   REVIEW OF SYSTEMS: Full 14 system review of systems  performed and notable only for as above    ALLERGIES: Allergies  Allergen Reactions  . Amoxicillin Rash  . Clindamycin/Lincomycin Rash    HOME MEDICATIONS: Current Outpatient Prescriptions on File Prior to Visit  Medication Sig Dispense Refill  . CREATINE PO Take by mouth daily.      . diclofenac (VOLTAREN) 75 MG EC tablet Take 1 tablet (75 mg total) by mouth 2 (two) times daily.  60 tablet  11  . omeprazole (PRILOSEC) 20 MG capsule Take 1 capsule (20 mg total) by mouth daily.  30 capsule  11     PAST MEDICAL HISTORY: Past Medical History  Diagnosis Date  . Allergy   . Asthma   . Peptic ulcer 2002    [facility] [place]EGD showed 2 ulcers  . Neck pain   . Muscle pain     PAST SURGICAL HISTORY: Past Surgical History  Procedure Laterality Date  . Appendectomy    . Spine surgery  1/11, 9/11    discectomy  . Shoulder surgery Bilateral 2004    FAMILY HISTORY: History reviewed. No pertinent family history.  SOCIAL HISTORY:  History   Social History  . Marital Status: Married    Spouse Name: Brandon Manislizabeth    Number of Children: 4  . Years of Education: college   Occupational History  . Not on file.   Social History Main Topics  . Smoking status: Never Smoker   . Smokeless tobacco: Never Used  . Alcohol Use: No  . Drug Use: No  . Sexual Activity: Yes    Birth Control/ Protection: None  Other Topics Concern  .    Social History Narrative   Patient lives at home with his wife Brandon Manis(Elizabeth). Patient has four children.   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   Education [employer] and is going to college now.   Right handed.   Caffeine one two liter daily mountain dew.     PHYSICAL EXAM   Filed Vitals:    Not recorded      Cannot calculate BMI with a height equal to zero.   Generalized: In no acute distress  Neck: Supple, no carotid bruits   Cardiac: Regular rate rhythm  Pulmonary: Clear to auscultation  bilaterally  Musculoskeletal: No deformity  Neurological examination  Mentation: Alert oriented to time, place, history taking, and causual conversation, he has mild retrocollis, mild  left tilt, left shoulder elevations  Cranial nerve II-XII: Pupils were equal round reactive to light. Extraocular movements were full.  Visual field were full on confrontational test. Bilateral fundi were sharp.  Facial sensation and strength were normal. Hearing was intact to finger rubbing bilaterally. Uvula tongue midline.  Head turning and shoulder shrug and were normal and symmetric.Tongue protrusion into cheek strength was normal.  Motor: Normal tone, bulk and strength.  Sensory: Intact to fine touch, pinprick, preserved vibratory sensation, and proprioception at toes.  Coordination: Normal finger to nose, heel-to-shin bilaterally there was no truncal ataxia  Gait: Rising up from seated position without assistance, normal stance, without trunk ataxia, moderate stride, good arm swing, smooth turning, able to perform tiptoe, and heel walking without difficulty.   Romberg signs: Negative  Deep tendon reflexes: Brachioradialis 2/2, biceps 2/2, triceps 2/2, patellar 2/2, Achilles 2/2, plantar responses were flexor bilaterally.   DIAGNOSTIC DATA (LABS, IMAGING, TESTING) - I reviewed patient records, labs, notes, testing and imaging myself where available.  Lab Results  Component Value Date   WBC 8.8 09/18/2013   HGB 17.1* 09/18/2013   HCT 50.0 09/18/2013   MCV 89.0 09/18/2013   PLT 245 09/18/2013      Component Value Date/Time   NA 136 09/18/2013 1059   K 4.9 09/18/2013 1059   CL 100 09/18/2013 1059   CO2 30 09/18/2013 1059   GLUCOSE 73 09/18/2013 1059   BUN 6 09/18/2013 1059   CREATININE 1.26 09/18/2013 1059   CALCIUM 9.6 09/18/2013 1059   PROT 7.3 09/18/2013 1059   ALBUMIN 4.2 09/18/2013 1059   AST 19 09/18/2013 1059   ALT 18 09/18/2013 1059   ALKPHOS 67 09/18/2013 1059   BILITOT 0.7  09/18/2013 1059   GFRNONAA 76 09/18/2013 1059   GFRAA 87 09/18/2013 1059   Lab Results  Component Value Date   TSH 0.967 09/18/2013      ASSESSMENT AND PLAN  Brandon ReeveJosh Fran LowesHolder is a 33 y.o. male complains of  chronic neck pain, radiating pain to his right arm, and right shoulder, recent MRI of the cervical spine per patient report no significant canal, or foraminal stenosis, mild degenerative disc disease at C3-4-5   Under EMG guidance, 200 units of xeomin was injected into his right cervical paraspinal muscles  Left levator scapular 25 units  Right splenius capitis 25 Right splenius cervix 25 Right longissimus capitis 25 Right semispinalis 25  Right levator scapular 25 Right rhomboid 25 units  Right upper trapezius 25 units Right illiocostalis 25 units  He tolerated the injection well, will return to clinic in 3 months for repeat injection  Levert FeinsteinYijun Dietrich Ke, M.D. Ph.D.  [facility] [phone]  [address], [place], [place] [address] [phone]

## 2014-08-07 ENCOUNTER — Ambulatory Visit (INDEPENDENT_AMBULATORY_CARE_PROVIDER_SITE_OTHER): Payer: BLUE CROSS/BLUE SHIELD | Admitting: Family Medicine

## 2014-08-07 ENCOUNTER — Encounter: Payer: Self-pay | Admitting: Family Medicine

## 2014-08-07 VITALS — BP 128/90 | HR 100 | Temp 98.1°F | Resp 20 | Wt 204.0 lb

## 2014-08-07 DIAGNOSIS — M5440 Lumbago with sciatica, unspecified side: Secondary | ICD-10-CM

## 2014-08-07 MED ORDER — MELOXICAM 15 MG PO TABS: 15.0000 mg | ORAL_TABLET | Freq: Every day | ORAL | Status: DC

## 2014-08-07 MED ORDER — PREDNISONE 20 MG PO TABS: ORAL_TABLET | ORAL | Status: DC

## 2014-08-07 MED ORDER — CELECOXIB 200 MG PO CAPS: 200.0000 mg | ORAL_CAPSULE | Freq: Two times a day (BID) | ORAL | Status: DC

## 2014-08-07 NOTE — Progress Notes (Signed)
Subjective:    Patient ID: Brandon Christensen, male    DOB: Dec 30, 1980, 34 y.o.   MRN: [id number]  HPI  Patient has a history of chronic low back pain..  When he was 19 he injured his back and suffered a herniated disc at L5-S1. He underwent surgery on 2 separate occasions drip remove a disc fragment from impingement on his right nerve root at that level. Ever since that time he has had recurrent low back pain with right-sided sciatica. At the present time he is having severe low back pain with right-sided sciatica. He complains of numbness and tingling radiating down his right leg. He complains of near daily chronic low back pain. The patient is currently taking Celebrex 200 mg once a day. He tries to do this most days due to his history of peptic ulcer disease to root reduce his risk of GI toxicity. He would like a prescription for meloxicam that he could take in place of the Celebrex during flareups because he believes this medication works better. He is fully aware of the increased GI risk on the meloxicam and he takes Nexium on a daily basis to protect his stomach. Today he is requesting a temporary treatment with a prednisone Dosepak to help treat the inflammation in his back and treat the right-sided sciatica. Past Medical History  Diagnosis Date  . Allergy   . Asthma   . Peptic ulcer 2002    Had EGD- in [facility] [place]--EGD showed 2 ulcers  . Neck pain   . Muscle pain    Past Surgical History  Procedure Laterality Date  . Appendectomy    . Spine surgery  1/11, 9/11    discectomy  . Shoulder surgery Bilateral 2004   Current Outpatient Prescriptions on File Prior to Visit  Medication Sig Dispense Refill  . cyclobenzaprine (FLEXERIL) 10 MG tablet Take 1 tablet (10 mg total) by mouth 2 (two) times daily. 60 tablet 6  . gabapentin (NEURONTIN) 300 MG capsule Take 1 capsule (300 mg total) by mouth 3 (three) times daily. 90 capsule 11  . Celecoxib (CELEBREX PO) Take by mouth as needed.    Marland Kitchen  CREATINE PO Take by mouth daily.    . diclofenac (VOLTAREN) 75 MG EC tablet Take 1 tablet (75 mg total) by mouth 2 (two) times daily. (Patient not taking: Reported on 08/07/2014) 60 tablet 11  . omeprazole (PRILOSEC) 20 MG capsule Take 1 capsule (20 mg total) by mouth daily. (Patient not taking: Reported on 08/07/2014) 30 capsule 11   No current facility-administered medications on file prior to visit.   Allergies  Allergen Reactions  . Amoxicillin Rash  . Clindamycin/Lincomycin Rash   History   Social History  . Marital Status: Married    Spouse Name: Brandon Christensen    Number of Children: 4  . Years of Education: college   Occupational History  . Not on file.   Social History Main Topics  . Smoking status: Never Smoker   . Smokeless tobacco: Never Used  . Alcohol Use: No  . Drug Use: No  . Sexual Activity: Yes    Birth Control/ Protection: None   Other Topics Concern  . Not on file   Social History Narrative   Patient lives at home with his wife Brandon Christensen). Patient has four children.   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   Education [employer] and is going to college now.   Right handed.   Caffeine one two  liter daily mountain dew.     Review of Systems  All other systems reviewed and are negative.      Objective:   Physical Exam  Constitutional: He is oriented to person, place, and time.  Cardiovascular: Normal rate, regular rhythm and normal heart sounds.   Pulmonary/Chest: Effort normal and breath sounds normal.  Musculoskeletal:       Lumbar back: He exhibits decreased range of motion, tenderness and pain. He exhibits no bony tenderness.  Neurological: He is oriented to person, place, and time. He has normal reflexes. No cranial nerve deficit. He exhibits normal muscle tone. Coordination normal.  Vitals reviewed.  patient has an antalgic gait and walks with a limp        Assessment & Plan:  Midline low back pain with sciatica,  sciatica laterality unspecified - Plan: meloxicam (MOBIC) 15 MG tablet, predniSONE (DELTASONE) 20 MG tablet  Patient has degenerative disc disease in the lumbar spine, history of pars interarticularis defect, herniated nucleus pulposus and right-sided sciatica. Temporarily will treat the patient with prednisone as I anticipate that there is a bulging disc impinging upon his right nerve root. He can also use gabapentin as needed for nerve pain. I also gave the patient a prescription for Celebrex 200 mg by mouth twice a day. I will prefer that the patient use this on a daily basis given his history of peptic ulcer disease to try to reduce the risk of GI toxicity is much as possible. Also gave the patient prescription for meloxicam. I explained to the patient that he is not to use this on a daily basis but he can use this sparingly in place of the Celebrex during exacerbations. At the present time however he is only to use the prednisone for the next 6 days.

## 2014-11-26 ENCOUNTER — Telehealth: Payer: Self-pay | Admitting: Physician Assistant

## 2014-11-26 NOTE — Telephone Encounter (Signed)
Pt says his pain meds have got him constipated and now has big painful hemorrhoid and nothing is helping it.

## 2014-11-26 NOTE — Telephone Encounter (Signed)
Patient is calling to say that he had back surgery last week and is taking meds for this that have made him constipated, now has a hemorrhoid would like to know if we suggest anything to help him with this  Please call him at [phone]

## 2014-11-27 ENCOUNTER — Encounter: Payer: Self-pay | Admitting: Family Medicine

## 2014-11-27 ENCOUNTER — Ambulatory Visit (INDEPENDENT_AMBULATORY_CARE_PROVIDER_SITE_OTHER): Payer: BLUE CROSS/BLUE SHIELD | Admitting: Family Medicine

## 2014-11-27 VITALS — BP 118/66 | HR 88 | Temp 98.2°F | Resp 14 | Ht 72.0 in | Wt 184.0 lb

## 2014-11-27 DIAGNOSIS — K648 Other hemorrhoids: Secondary | ICD-10-CM | POA: Diagnosis not present

## 2014-11-27 DIAGNOSIS — J189 Pneumonia, unspecified organism: Secondary | ICD-10-CM | POA: Diagnosis not present

## 2014-11-27 DIAGNOSIS — Z981 Arthrodesis status: Secondary | ICD-10-CM | POA: Diagnosis not present

## 2014-11-27 DIAGNOSIS — K644 Residual hemorrhoidal skin tags: Secondary | ICD-10-CM

## 2014-11-27 MED ORDER — TRAMADOL HCL 50 MG PO TABS: 50.0000 mg | ORAL_TABLET | Freq: Four times a day (QID) | ORAL | Status: AC | PRN

## 2014-11-27 MED ORDER — HYDROCORTISONE ACETATE 25 MG RE SUPP: 25.0000 mg | Freq: Two times a day (BID) | RECTAL | Status: AC

## 2014-11-27 MED ORDER — AZITHROMYCIN 250 MG PO TABS: ORAL_TABLET | ORAL | Status: AC

## 2014-11-27 NOTE — Patient Instructions (Signed)
Get Chest xray done next week- Mon/Tuesday  -  [address], [place], [address] Use hemorrhoid medication New antibiotics sent Ultram for pain F/U as needed

## 2014-11-27 NOTE — Progress Notes (Signed)
Patient ID: Brandon Christensen, male   DOB: 31-Jul-1981, 34 y.o.   MRN: [id number]   Subjective:    Patient ID: Brandon Christensen, male    DOB: 31-Jul-1981, 34 y.o.   MRN: [id number]  Patient presents for ER F/U and Hemmorrhoids  patient here to follow-up. He is status [facility]. He was in the hospital Monday through Wednesday of last week and was discharged home. He was on Percocet for pain and had difficulty having a bowel movement for a few days. He has history of internal hemorrhoids. After a very large bowel movement he had a prolapse of his hemorrhoid he's been using Epson salt as well as Tucks pads with minimal improvement.  During this time as well he began having chills with mild cough and congestion. [facility] [place] with pneumonia and he was given Levaquin however he had side effect to this antibiotic where he felt very anxious and cannot sleep he was also taking promethazine with this. He does not want to take the antibiotic any further and is actually discontinued the pain medication as well because he does not like the side effects of all the meds. He still has some cough and congestion but has not had any fever the past couple days. He has mild underlying asthma and states that he wheezes from time to time but never felt short of breath therefore does not use any inhalers.  She will like to try tramadol which she is using the past for his pain incident of using the Percocet.    Review Of Systems:  GEN- denies fatigue, fever, weight loss,weakness, recent illness HEENT- denies eye drainage, change in vision, nasal discharge, CVS- denies chest pain, palpitations RESP- denies SOB, cough, wheeze ABD- denies N/V, change in stools, abd pain GU- denies dysuria, hematuria, dribbling, incontinence MSK-+ joint pain, muscle aches, injury Neuro- denies headache, dizziness, syncope, seizure activity        Objective:    BP 118/66 mmHg  Pulse 88  Temp(Src) 98.2 F (36.8 C) (Oral)  Resp 14  Ht 6' (1.829 m)  Wt 184 lb (83.462 kg)  BMI 24.95 kg/m2  SpO2 99% GEN- NAD, alert and oriented x3 HEENT- PERRL, EOMI, non injected sclera, pink conjunctiva, MMM, oropharynx clear Neck- Supple, no LAD CVS- RRR, no murmur RESP-scatterted wheeze, no rales, no rhonchi MSK- midline lumbar incision d/c/i mild swelling around the incison Rectum- normal tone, external hemorroid + swelling, no clot palpated noted, non tender, no gross blood EXT- No edema Pulses- Radial, 2+        Assessment & Plan:      Problem List Items Addressed This Visit    None    Visit Diagnoses    External prolapsed hemorrhoids    -  Primary    Given anusol, sitz baths    CAP (community acquired pneumonia)        Change to Azithromycin, will get CXR next week, no true symptoms of PNA noted, normal sats    Relevant Medications    azithromycin (ZITHROMAX) 250 MG tablet    Other Relevant Orders    DG Chest 2 View    S/P spinal fusion        Discussed meds, will prescribe Ultram, pt advised to use stool softeners to help with constipation       Note: This dictation was prepared with Dragon dictation along with smaller phrase technology. Any transcriptional  errors that result from this process are unintentional.

## 2014-12-05 ENCOUNTER — Ambulatory Visit (HOSPITAL_COMMUNITY)
Admission: RE | Admit: 2014-12-05 | Discharge: 2014-12-05 | Disposition: A | Discharge status: Home / Self Care | Payer: BLUE CROSS/BLUE SHIELD | Source: Ambulatory Visit | Attending: Family Medicine | Admitting: Family Medicine

## 2014-12-05 DIAGNOSIS — J189 Pneumonia, unspecified organism: Secondary | ICD-10-CM | POA: Diagnosis present

## 2014-12-05 DIAGNOSIS — J45909 Unspecified asthma, uncomplicated: Secondary | ICD-10-CM | POA: Diagnosis not present

## 2015-01-31 ENCOUNTER — Telehealth: Payer: Self-pay

## 2015-01-31 NOTE — Telephone Encounter (Signed)
LVM for patient to call office to discuss botox.   

## 2015-05-21 ENCOUNTER — Telehealth: Payer: Self-pay | Admitting: Neurology

## 2015-05-21 NOTE — Telephone Encounter (Signed)
Patient has declined injections for Xeomin.

## 2016-10-01 IMAGING — DX DG CHEST 2V
2 series · 2 of 2 positions shown · non-contrast
Comparison: None.

CLINICAL DATA: Diagnosed with pneumonia following back surgery 10
days ago, currently asymptomatic, history of asthma

EXAM:
CHEST  2 VIEW

[chest pa]
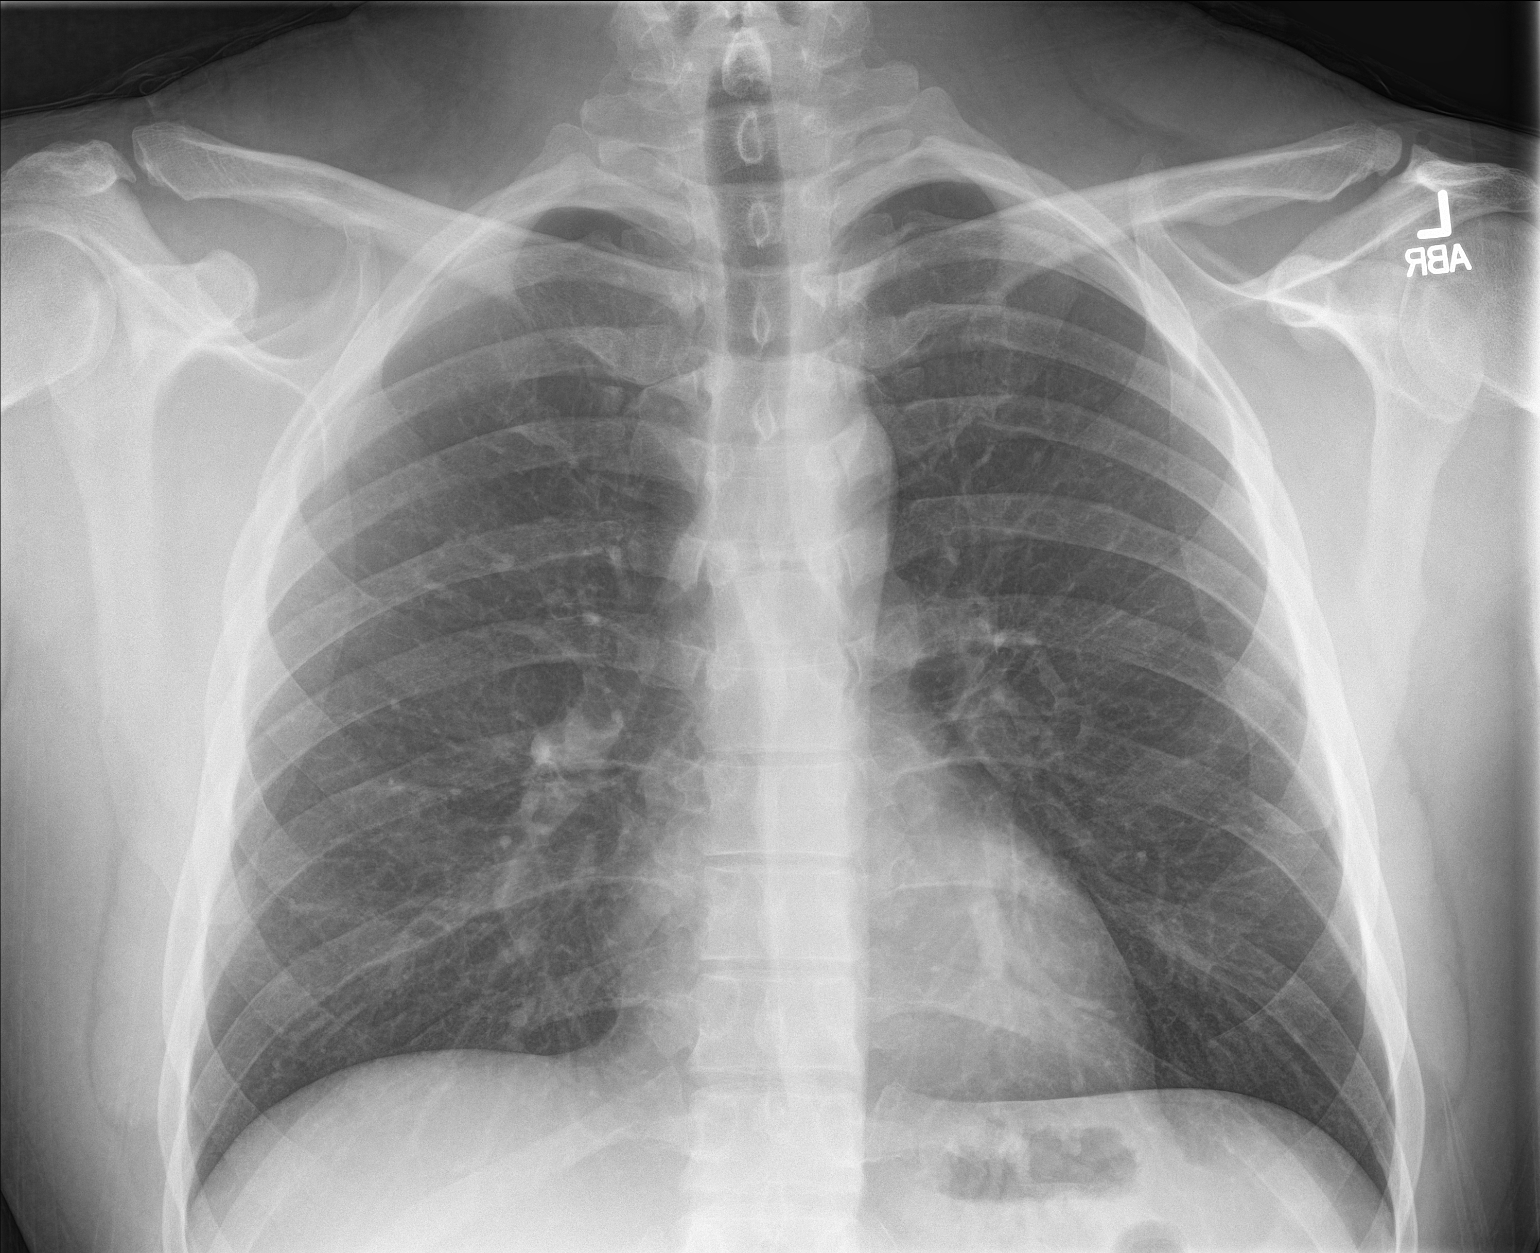

[chest lat]
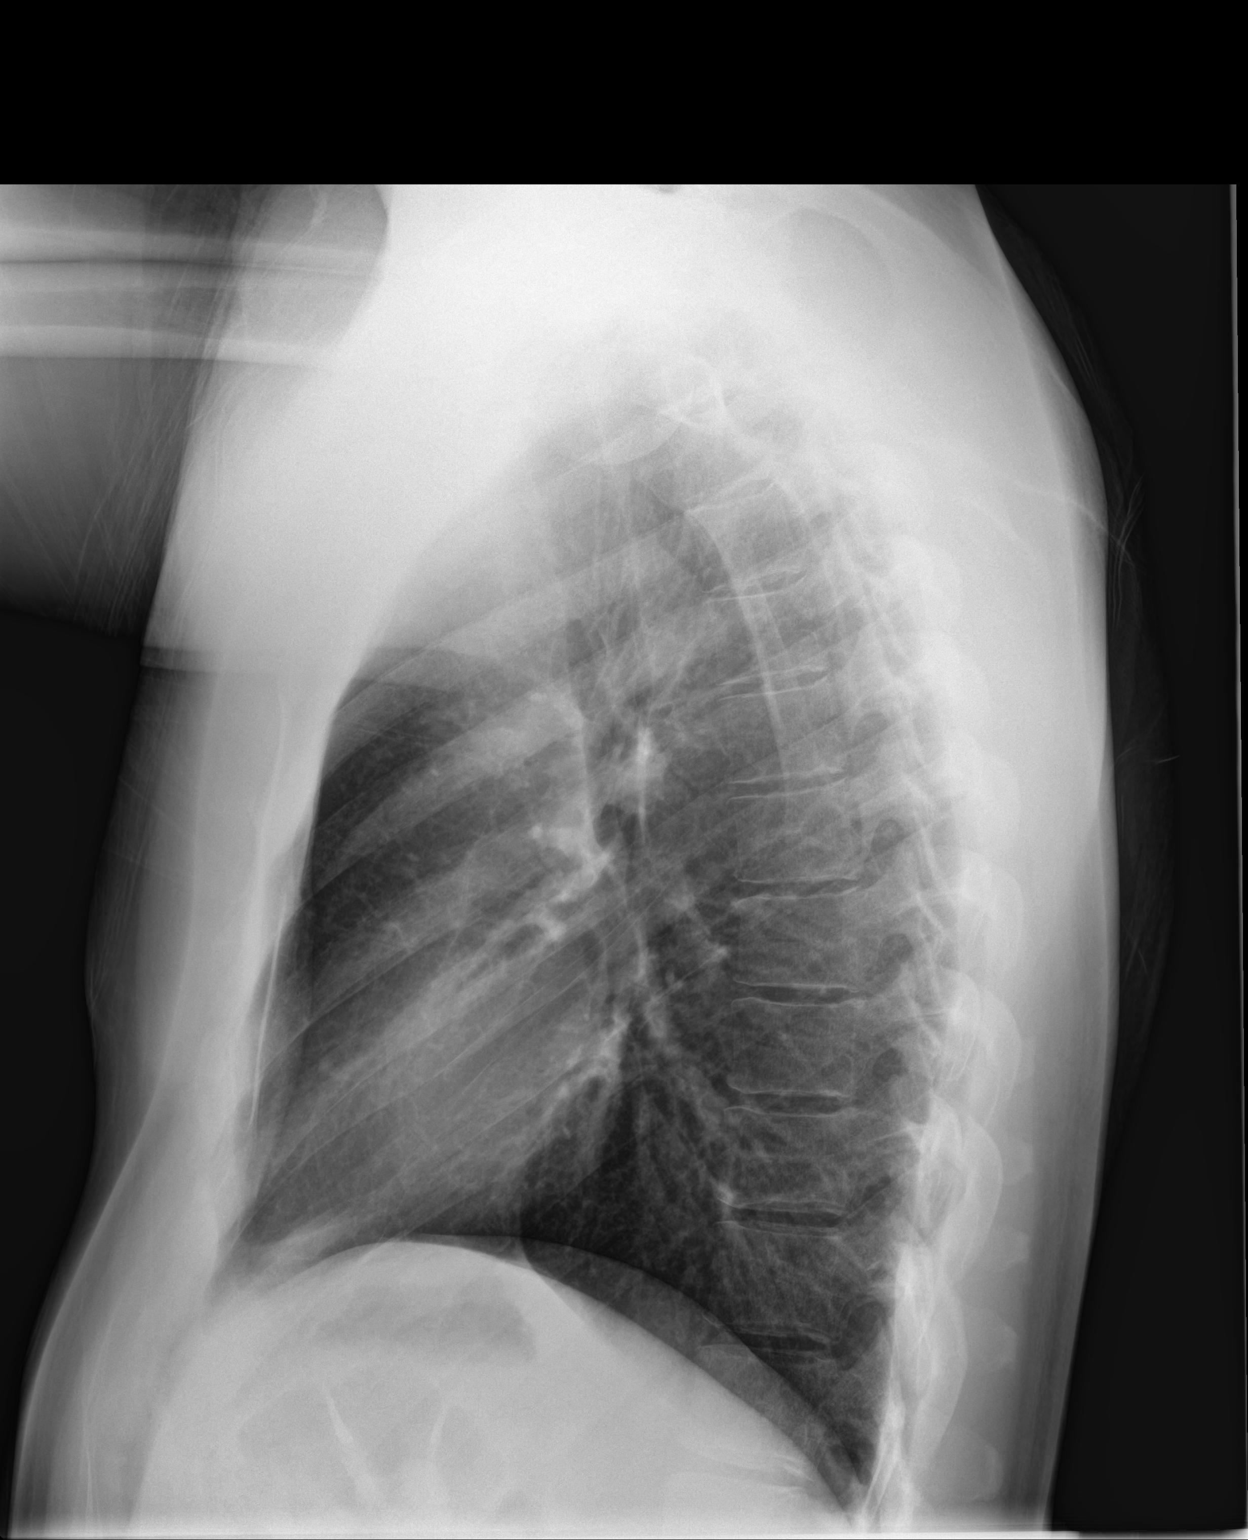

[2 of 2 positions shown; findings below may reference images not displayed]

FINDINGS: The lungs are mildly hyperinflated. There is no focal infiltrate.
There is no pleural effusion. The heart and pulmonary vascularity
are normal. The mediastinum is normal in width. The trachea is
midline. The bony thorax exhibits no acute abnormality.
IMPRESSION: Mild hyperinflation consistent with reactive airway disease. There
is no acute cardiopulmonary abnormality.
